# Patient Record
Sex: Male | Born: 1995 | Race: Black or African American | Hispanic: No | Marital: Single | Smoking: Never smoker
Health system: Southern US, Community
[De-identification: ages and names within clinical notes are randomized; demographics above are authoritative.]

---

## 2020-04-20 ENCOUNTER — Emergency Department (HOSPITAL_COMMUNITY): Payer: Self-pay

## 2020-04-20 ENCOUNTER — Other Ambulatory Visit: Payer: Self-pay

## 2020-04-20 ENCOUNTER — Encounter (HOSPITAL_COMMUNITY): Payer: Self-pay | Admitting: Emergency Medicine

## 2020-04-20 ENCOUNTER — Emergency Department (HOSPITAL_COMMUNITY)
Admission: EM | Admit: 2020-04-20 | Discharge: 2020-04-20 | Disposition: A | Discharge status: Home / Self Care | Payer: Self-pay | Attending: Emergency Medicine | Admitting: Emergency Medicine

## 2020-04-20 DIAGNOSIS — M25562 Pain in left knee: Secondary | ICD-10-CM | POA: Insufficient documentation

## 2020-04-20 MED ORDER — ACETAMINOPHEN 325 MG PO TABS
650.0000 mg | ORAL_TABLET | Freq: Once | ORAL | Status: AC
  Administered 2020-04-20: 650 mg via ORAL
  Filled 2020-04-20: qty 2

## 2020-04-20 NOTE — ED Notes (Signed)
Pt is sleeping unable to obtain vitals.

## 2020-04-20 NOTE — Discharge Instructions (Addendum)
You can take 600 mg of ibuprofen every 6 hours, you can take 1000 mg of Tylenol every 6 hours, you can alternate these every 3 or you can take them together.  

## 2020-04-20 NOTE — ED Provider Notes (Signed)
[facility] EMERGENCY DEPARTMENT Provider Note   CSN: [id number] Arrival date & time: 04/20/20  0040     History Chief Complaint  Patient presents with  . Knee Injury    Allen Byrd is a 24 y.o. male.   Knee Pain Location:  Knee Knee location:  L knee Pain details:    Quality:  Aching   Radiates to:  Does not radiate   Severity:  Moderate   Onset quality:  Sudden   Timing:  Constant   Progression:  Unchanged Chronicity:  New Dislocation: no   Foreign body present:  No foreign bodies Prior injury to area:  No Relieved by:  Nothing Worsened by:  Bearing weight Ineffective treatments:  None tried Associated symptoms: swelling   Associated symptoms: no back pain and no fever        History reviewed. No pertinent past medical history.  There are no problems to display for this patient.   History reviewed. No pertinent surgical history.     No family history on file.  Social History   Tobacco Use  . Smoking status: Not on file  Substance Use Topics  . Alcohol use: Not on file  . Drug use: Not on file    Home Medications Prior to Admission medications   Not on File    Allergies    Patient has no known allergies.  Review of Systems   Review of Systems  Constitutional: Negative for chills and fever.  HENT: Negative for congestion and rhinorrhea.   Respiratory: Negative for cough and shortness of breath.   Cardiovascular: Negative for chest pain and palpitations.  Gastrointestinal: Negative for diarrhea, nausea and vomiting.  Genitourinary: Negative for difficulty urinating and dysuria.  Musculoskeletal: Positive for arthralgias and joint swelling. Negative for back pain.  Skin: Negative for color change and rash.  Neurological: Negative for light-headedness and headaches.    Physical Exam Updated Vital Signs BP (!) 105/62 (BP Location: Right Arm)   Pulse 57   Temp 98.9 F (37.2 C) (Oral)   Resp 15   Ht  5\' 8"  (1.727 m)   Wt 90 kg   SpO2 99%   BMI 30.17 kg/m   Physical Exam Vitals and nursing note reviewed.  Constitutional:      General: He is not in acute distress.    Appearance: Normal appearance.  HENT:     Head: Normocephalic and atraumatic.     Nose: No rhinorrhea.  Eyes:     General:        Right eye: No discharge.        Left eye: No discharge.     Conjunctiva/sclera: Conjunctivae normal.  Cardiovascular:     Rate and Rhythm: Normal rate and regular rhythm.  Pulmonary:     Effort: Pulmonary effort is normal.     Breath sounds: No stridor.  Abdominal:     General: Abdomen is flat. There is no distension.     Palpations: Abdomen is soft.  Musculoskeletal:        General: Tenderness present. No deformity.     Comments: Decreased range of motion, tenderness to palpation of the medial joint line.  No laxity of the ACL PCL or lateral ligaments.  Mild effusion.  No significant deformity, intact vasculature motor function and sensation all of the left lower extremity  Skin:    General: Skin is warm and dry.  Neurological:     General: No focal deficit present.  Mental Status: He is alert. Mental status is at baseline.     Motor: No weakness.  Psychiatric:        Mood and Affect: Mood normal.        Behavior: Behavior normal.        Thought Content: Thought content normal.     ED Results / Procedures / Treatments   Labs (all labs ordered are listed, but only abnormal results are displayed) Labs Reviewed - No data to display  EKG None  Radiology DG Knee Complete 4 Views Left  Result Date: 04/20/2020 CLINICAL DATA:  Pain while playing football EXAM: LEFT KNEE - COMPLETE 4+ VIEW COMPARISON:  None. FINDINGS: No evidence of fracture, dislocation, or joint effusion. No evidence of arthropathy or other focal bone abnormality. Soft tissues are unremarkable. IMPRESSION: Negative. Electronically Signed   By: Jonna Clark M.D.   On: 04/20/2020 01:43     Procedures Procedures (including critical care time)  Medications Ordered in ED Medications  acetaminophen (TYLENOL) tablet 650 mg (650 mg Oral Given 04/20/20 3710)    ED Course  I have reviewed the triage vital signs and the nursing notes.  Pertinent labs & imaging results that were available during my care of the patient were reviewed by me and considered in my medical decision making (see chart for details).    MDM Rules/Calculators/A&P                          Football related injury, yesterday, was able to bear weight afterwards.  Was having worsening pain today.  Felt a pop.  Concern for ligamentous or meniscus injury.  Plain films obtained by nursing at triage, were reviewed by myself and radiology and show no fracture or malalignment.  He will need outpatient management by orthopedics for further investigation.  Crutches knee immobilizer over-the-counter medications are given.  Strict return precautions given.  No signs of compartment syndrome on exam soft nontender musculature.  Pt is safe for DC home with outpatient follow up. The patient agrees with the plan and has no other questions or concerns.   Final Clinical Impression(s) / ED Diagnoses Final diagnoses:  Acute pain of left knee    Rx / DC Orders ED Discharge Orders         Ordered    AMB referral to orthopedics     Discontinue  Reprint     04/20/20 1106           Sabino Donovan, MD 04/20/20 1112

## 2020-04-20 NOTE — ED Triage Notes (Signed)
Patient reports left knee pain injured while playing football today , pain increases with movement/weaight bearing .

## 2020-04-20 NOTE — Progress Notes (Signed)
Orthopedic Tech Progress Note Patient Details:  Allen Byrd [facility] 07-24-1996 [id number]  Ortho Devices Type of Ortho Device: Knee Immobilizer, Crutches Ortho Device/Splint Location: Left Lower Extremity Ortho Device/Splint Interventions: Ordered, Application   Post Interventions Patient Tolerated: Well Instructions Provided: Adjustment of device, Care of device, Poper ambulation with device   [name] P Harle Stanford 04/20/2020, 11:58 AM

## 2020-04-23 ENCOUNTER — Ambulatory Visit (INDEPENDENT_AMBULATORY_CARE_PROVIDER_SITE_OTHER): Payer: Self-pay | Admitting: Orthopedic Surgery

## 2020-04-23 ENCOUNTER — Encounter: Payer: Self-pay | Admitting: Orthopedic Surgery

## 2020-04-23 VITALS — Ht 68.0 in | Wt 198.0 lb

## 2020-04-23 DIAGNOSIS — M25562 Pain in left knee: Secondary | ICD-10-CM

## 2020-04-23 DIAGNOSIS — M25462 Effusion, left knee: Secondary | ICD-10-CM

## 2020-04-27 ENCOUNTER — Encounter: Payer: Self-pay | Admitting: Orthopedic Surgery

## 2020-04-27 NOTE — Progress Notes (Signed)
Office Visit Note   Patient: Allen Byrd           Date of Birth: 18-Aug-1996           MRN: [id number] Visit Date: 04/23/2020 Requested by: Sabino Donovan, MD [address] [place],  [place] [address] PCP: Patient, No Pcp Per  Subjective: Chief Complaint  Patient presents with  . Left Knee - Pain    HPI: Allen Byrd is a 24 y.o. male who presents to the office complaining of left knee pain.  Patient states that he injured his left knee 4 to 5 days ago when he was playing football.  He fell while being tackled by 2 players.  He notes pain at the time of injury but he was able to return to the game and played the entire rest of the game as well.  He states that his left knee swelled later that night.  He has noted painful weightbearing since he night following the injury.  He states that his knee "feels unstable".  He does note a history of patellar tendinitis previously.  He denies any history of surgery to the left knee.  He has been taking Tylenol to help with pain..                ROS: All systems reviewed are negative as they relate to the chief complaint within the history of present illness.  Patient denies fevers or chills.  Assessment & Plan: Visit Diagnoses:  1. Acute pain of left knee     Plan: Patient is a 24 year old male who presents about 5 days out from football injury.  He fell while being tackled.  He has a video of the incident and is difficult to identify the specific mechanism but it appears that he fell going down on his left knee while being tackled by 2 other players from behind.  He has a large effusion on exam today as well as grade 2 PCL laxity.  The left knee was aspirated of fluid today.  Radiographs from the ER were reviewed and found to be negative for any acute findings.  A hinged knee brace was provided to the patient.  Plan to order MRI of the left knee for further evaluation.  Follow-up to review MRI results.  Patient  agreed with plan.  Follow-Up Instructions: No follow-ups on file.   Orders:  Orders Placed This Encounter  Procedures  . MR Knee Left w/o contrast   No orders of the defined types were placed in this encounter.     Procedures: No procedures performed   Clinical Data: No additional findings.  Objective: Vital Signs: Ht 5\' 8"  (1.727 m)   Wt 198 lb (89.8 kg)   BMI 30.11 kg/m   Physical Exam:  Constitutional: Patient appears well-developed HEENT:  Head: Normocephalic Eyes:EOM are normal Neck: Normal range of motion Cardiovascular: Normal rate Pulmonary/chest: Effort normal Neurologic: Patient is alert Skin: Skin is warm Psychiatric: Patient has normal mood and affect  Ortho Exam: Ortho exam demonstrates left knee with positive effusion.  Extensor mechanism is intact.  He has no significant tenderness to palpation throughout the left knee.  No significant laxity with collateral ligaments.  ACL has good endpoint with Lachman exam without significant laxity compared with the contralateral knee.  No posterior lateral rotatory instability.  He does have grade 2 PCL laxity with pain that is elicited with stressing the PCL.  Negative straight leg raise.  No pain with  internal rotation/external rotation of the left hip joint.  Specialty Comments:  No specialty comments available.  Imaging: No results found.   PMFS History: There are no problems to display for this patient.  No past medical history on file.  No family history on file.  No past surgical history on file. Social History   Occupational History  . Not on file  Tobacco Use  . Smoking status: Never Smoker  . Smokeless tobacco: Never Used  Vaping Use  . Vaping Use: Never used  Substance and Sexual Activity  . Alcohol use: Not on file  . Drug use: Not on file  . Sexual activity: Not on file

## 2020-05-16 ENCOUNTER — Other Ambulatory Visit: Payer: Self-pay

## 2020-05-16 ENCOUNTER — Ambulatory Visit
Admission: RE | Admit: 2020-05-16 | Discharge: 2020-05-16 | Disposition: A | Discharge status: Home / Self Care | Payer: Self-pay | Source: Ambulatory Visit | Attending: Orthopedic Surgery | Admitting: Orthopedic Surgery

## 2020-05-16 DIAGNOSIS — M25562 Pain in left knee: Secondary | ICD-10-CM

## 2020-05-21 ENCOUNTER — Ambulatory Visit (INDEPENDENT_AMBULATORY_CARE_PROVIDER_SITE_OTHER): Payer: Self-pay | Admitting: Orthopedic Surgery

## 2020-05-21 DIAGNOSIS — S83522A Sprain of posterior cruciate ligament of left knee, initial encounter: Secondary | ICD-10-CM

## 2020-05-21 DIAGNOSIS — M25462 Effusion, left knee: Secondary | ICD-10-CM

## 2020-05-25 ENCOUNTER — Encounter: Payer: Self-pay | Admitting: Orthopedic Surgery

## 2020-05-25 NOTE — Progress Notes (Signed)
Office Visit Note   Patient: Allen Byrd           Date of Birth: 1996-09-01           MRN: [id number] Visit Date: 05/21/2020 Requested by: No referring provider defined for this encounter. PCP: Patient, No Pcp Per  Subjective: Chief Complaint  Patient presents with  . Follow-up    HPI: [name] is a 24 y.o. male who presents to the office complaining of left knee pain.  He presents for MRI review of the left knee.  MRI of the left knee revealed high-grade partial tear of the PCL.  He notes that he is feeling improved since last office visit.  He denies any recurrent symptomatic instability of the left knee since last visit.  His range of motion has improved.  He does play club football and he has not returned to play yet.  He works in a [employer] where he does not lift a lot and instead mostly "drills things".               ROS: All systems reviewed are negative as they relate to the chief complaint within the history of present illness.  Patient denies fevers or chills.  Assessment & Plan: Visit Diagnoses:  1. Knee effusion, left   2. Tear of PCL (posterior cruciate ligament) of knee, left, initial encounter     Plan: Patient is a 24 year old male who presents for reevaluation of left knee injury and MRI review.  MRI results are as described above in HPI.  He does have continued PCL laxity on exam.  Fortunately, he has no recurrent symptomatic instability of the left knee.  No mechanical symptoms.  Plan for patient to begin outpatient physical therapy 2 times per week.  He will focus on quadricep strengthening with blood flow restriction therapy.  Follow-up in 3 weeks for clinical recheck.  His goal is to return to playing football.  Follow-Up Instructions: No follow-ups on file.   Orders:  Orders Placed This Encounter  Procedures  . Ambulatory referral to Physical Therapy   No orders of the defined types were placed in this  encounter.     Procedures: No procedures performed   Clinical Data: No additional findings.  Objective: Vital Signs: There were no vitals taken for this visit.  Physical Exam:  Constitutional: Patient appears well-developed HEENT:  Head: Normocephalic Eyes:EOM are normal Neck: Normal range of motion Cardiovascular: Normal rate Pulmonary/chest: Effort normal Neurologic: Patient is alert Skin: Skin is warm Psychiatric: Patient has normal mood and affect  Ortho Exam: Ortho exam demonstrates left knee with positive effusion.  Extensor mechanism intact.  No laxity with Lachman exam varus/valgus stress.  There is grade 2 PCL laxity that is equivalent to the previous examination.  Excellent range of motion with 0 degrees of extension and greater than 120 degrees of flexion.  No tenderness over the medial/lateral joint lines.  Specialty Comments:  No specialty comments available.  Imaging: No results found.   PMFS History: There are no problems to display for this patient.  No past medical history on file.  No family history on file.  No past surgical history on file. Social History   Occupational History  . Not on file  Tobacco Use  . Smoking status: Never Smoker  . Smokeless tobacco: Never Used  Vaping Use  . Vaping Use: Never used  Substance and Sexual Activity  . Alcohol use: Not on file  . Drug use:  Not on file  . Sexual activity: Not on file

## 2020-05-26 ENCOUNTER — Encounter: Payer: Self-pay | Admitting: Orthopedic Surgery

## 2020-06-12 ENCOUNTER — Ambulatory Visit: Payer: Self-pay | Admitting: Physical Therapy

## 2020-06-27 ENCOUNTER — Ambulatory Visit: Payer: Self-pay | Admitting: Physical Therapy

## 2021-08-07 ENCOUNTER — Encounter (HOSPITAL_COMMUNITY): Payer: Self-pay | Admitting: Emergency Medicine

## 2021-08-07 ENCOUNTER — Other Ambulatory Visit: Payer: Self-pay

## 2021-08-07 ENCOUNTER — Emergency Department (HOSPITAL_COMMUNITY)
Admission: EM | Admit: 2021-08-07 | Discharge: 2021-08-08 | Disposition: A | Discharge status: Home / Self Care | Payer: Self-pay | Attending: Emergency Medicine | Admitting: Emergency Medicine

## 2021-08-07 DIAGNOSIS — J069 Acute upper respiratory infection, unspecified: Secondary | ICD-10-CM | POA: Insufficient documentation

## 2021-08-07 DIAGNOSIS — Z20822 Contact with and (suspected) exposure to covid-19: Secondary | ICD-10-CM | POA: Insufficient documentation

## 2021-08-07 LAB — COMPREHENSIVE METABOLIC PANEL
ALT: 32 U/L (ref 0–44)
AST: 26 U/L (ref 15–41)
Albumin: 3.6 g/dL (ref 3.5–5.0)
Alkaline Phosphatase: 91 U/L (ref 38–126)
Anion gap: 9 (ref 5–15)
BUN: 11 mg/dL (ref 6–20)
CO2: 26 mmol/L (ref 22–32)
Calcium: 8.7 mg/dL — ABNORMAL LOW (ref 8.9–10.3)
Chloride: 103 mmol/L (ref 98–111)
Creatinine, Ser: 1.18 mg/dL (ref 0.61–1.24)
GFR, Estimated: >60 mL/min (ref >60)
Glucose, Bld: 106 mg/dL — ABNORMAL HIGH (ref 70–99)
Potassium: 3.5 mmol/L (ref 3.5–5.1)
Sodium: 138 mmol/L (ref 135–145)
Total Bilirubin: 0.7 mg/dL (ref 0.3–1.2)
Total Protein: 7.1 g/dL (ref 6.5–8.1)

## 2021-08-07 LAB — CBC WITH DIFFERENTIAL/PLATELET
Abs Immature Granulocytes: 0.04 10*3/uL (ref 0.00–0.07)
Basophils Absolute: 0 10*3/uL (ref 0.0–0.1)
Basophils Relative: 0 %
Eosinophils Absolute: 0.2 10*3/uL (ref 0.0–0.5)
Eosinophils Relative: 2 %
HCT: 48.7 % (ref 39.0–52.0)
Hemoglobin: 16.4 g/dL (ref 13.0–17.0)
Immature Granulocytes: 0 %
Lymphocytes Relative: 7 %
Lymphs Abs: 0.7 10*3/uL (ref 0.7–4.0)
MCH: 29.4 pg (ref 26.0–34.0)
MCHC: 33.7 g/dL (ref 30.0–36.0)
MCV: 87.3 fL (ref 80.0–100.0)
Monocytes Absolute: 0.6 10*3/uL (ref 0.1–1.0)
Monocytes Relative: 6 %
Neutro Abs: 8.5 10*3/uL — ABNORMAL HIGH (ref 1.7–7.7)
Neutrophils Relative %: 85 %
Platelets: 319 10*3/uL (ref 150–400)
RBC: 5.58 MIL/uL (ref 4.22–5.81)
RDW: 12 % (ref 11.5–15.5)
WBC: 10 10*3/uL (ref 4.0–10.5)
nRBC: 0 % (ref 0.0–0.2)

## 2021-08-07 LAB — RESP PANEL BY RT-PCR (FLU A&B, COVID) ARPGX2
Influenza A by PCR: NEGATIVE
Influenza B by PCR: NEGATIVE
SARS Coronavirus 2 by RT PCR: NEGATIVE

## 2021-08-07 LAB — LIPASE, BLOOD: Lipase: 22 U/L (ref 11–51)

## 2021-08-07 MED ORDER — ONDANSETRON 4 MG PO TBDP
4.0000 mg | ORAL_TABLET | Freq: Once | ORAL | Status: AC
  Administered 2021-08-07: 4 mg via ORAL
  Filled 2021-08-07: qty 1

## 2021-08-07 MED ORDER — ACETAMINOPHEN 500 MG PO TABS
1000.0000 mg | ORAL_TABLET | Freq: Once | ORAL | Status: AC
  Administered 2021-08-07: 1000 mg via ORAL
  Filled 2021-08-07: qty 2

## 2021-08-07 NOTE — ED Triage Notes (Signed)
Pt c/o cough, n/v, and generalized pain since this morning.

## 2021-08-07 NOTE — ED Provider Notes (Signed)
Emergency Medicine Provider Triage Evaluation Note  Collie Wernick , a 25 y.o. male  was evaluated in triage.  Pt complains of fever, body aches, n/v, abd pain, cough. Sxs began this am. No sick contacts. No st or nasal congestion. No diarrhea or urine sxs. Has not taken anything. No medical problems.   Review of Systems  Positive: Cough, body aches, fever, n/v Negative: st  Physical Exam  BP (!) 142/100 (BP Location: Left Arm)   Pulse 92   Temp 98 F (36.7 C) (Oral)   Resp 16   SpO2 98%  Gen:   Awake, no distress   Resp:  Normal effort  MSK:   Moves extremities without difficulty  Other:  Mild diffuse ttp of the abd  Medical Decision Making  Medically screening exam initiated at 10:55 PM.  Appropriate orders placed.  Braidyn Scorsone was informed that the remainder of the evaluation will be completed by another provider, this initial triage assessment does not replace that evaluation, and the importance of remaining in the ED until their evaluation is complete.  Labs, resp panel   Alveria Apley, PA-C 08/07/21 2256    Virgina Norfolk, DO 08/08/21 1819

## 2021-08-08 ENCOUNTER — Emergency Department (HOSPITAL_COMMUNITY): Payer: Self-pay

## 2021-08-08 MED ORDER — ONDANSETRON 4 MG PO TBDP
4.0000 mg | ORAL_TABLET | Freq: Three times a day (TID) | ORAL | 0 refills | Status: AC | PRN
Start: 2021-08-08 — End: ?

## 2021-08-08 MED ORDER — ALBUTEROL SULFATE HFA 108 (90 BASE) MCG/ACT IN AERS
2.0000 | INHALATION_SPRAY | RESPIRATORY_TRACT | 0 refills | Status: AC | PRN
Start: 2021-08-08 — End: ?

## 2021-08-08 NOTE — ED Provider Notes (Signed)
[facility]-EMERGENCY DEPT Provider Note   CSN: [id number] Arrival date & time: 08/07/21  2231     History Chief Complaint  Patient presents with   Cough    Allen Byrd is a 25 y.o. male.  HPI     This is a 25 year old male who presents with body aches, diarrhea, nausea, vomiting, and cough.  Patient reports 1 day history of symptoms.  No known sick contacts.  No fevers but reports significant body aches and lower back pain that does not lateralize.  He also reports "rib pain."  States it is worse with coughing.  Reports a history of asthma but no recent hospitalization.  He did not take anything for his symptoms at home and did not use an inhaler.  Right now he states he is significantly improved after getting a gram of Tylenol from [facility] provider.  History reviewed. No pertinent past medical history.  There are no problems to display for this patient.   History reviewed. No pertinent surgical history.     No family history on file.  Social History   Tobacco Use   Smoking status: Never   Smokeless tobacco: Never  Vaping Use   Vaping Use: Never used    Home Medications Prior to Admission medications   Medication Sig Start Date End Date Taking? Authorizing Provider  albuterol (VENTOLIN HFA) 108 (90 Base) MCG/ACT inhaler Inhale 2 puffs into the lungs every 4 (four) hours as needed for wheezing or shortness of breath. 08/08/21  Yes Mileydi Milsap, Mayer Masker, MD  ondansetron (ZOFRAN ODT) 4 MG disintegrating tablet Take 1 tablet (4 mg total) by mouth every 8 (eight) hours as needed for nausea or vomiting. 08/08/21  Yes Lorenso Quirino, Mayer Masker, MD    Allergies    Latex  Review of Systems   Review of Systems  Constitutional:  Positive for chills and fever.  Respiratory:  Positive for cough. Negative for shortness of breath.   Cardiovascular:  Negative for chest pain.  Gastrointestinal:  Positive for diarrhea, nausea and vomiting. Negative  for abdominal pain.  Genitourinary:  Negative for dysuria.  All other systems reviewed and are negative.  Physical Exam Updated Vital Signs BP (!) 110/58 (BP Location: Left Arm)   Pulse 90   Temp 98 F (36.7 C) (Oral)   Resp 17   SpO2 97%   Physical Exam Vitals and nursing note reviewed.  Constitutional:      Appearance: He is well-developed. He is not ill-appearing.  HENT:     Head: Normocephalic and atraumatic.     Nose: Nose normal.     Mouth/Throat:     Mouth: Mucous membranes are moist.  Eyes:     Pupils: Pupils are equal, round, and reactive to light.  Cardiovascular:     Rate and Rhythm: Normal rate and regular rhythm.     Heart sounds: Normal heart sounds. No murmur heard. Pulmonary:     Effort: Pulmonary effort is normal. No respiratory distress.     Breath sounds: Normal breath sounds. No wheezing.  Abdominal:     Palpations: Abdomen is soft.     Tenderness: There is no abdominal tenderness. There is no rebound.  Musculoskeletal:     Cervical back: Neck supple.     Right lower leg: No edema.     Left lower leg: No edema.  Lymphadenopathy:     Cervical: No cervical adenopathy.  Skin:    General: Skin is warm and dry.  Neurological:  Mental Status: He is alert and oriented to person, place, and time.  Psychiatric:        Mood and Affect: Mood normal.    ED Results / Procedures / Treatments   Labs (all labs ordered are listed, but only abnormal results are displayed) Labs Reviewed  CBC WITH DIFFERENTIAL/PLATELET - Abnormal; Notable for the following components:      Result Value   Neutro Abs 8.5 (*)    All other components within normal limits  COMPREHENSIVE METABOLIC PANEL - Abnormal; Notable for the following components:   Glucose, Bld 106 (*)    Calcium 8.7 (*)    All other components within normal limits  RESP PANEL BY RT-PCR (FLU A&B, COVID) ARPGX2  LIPASE, BLOOD    EKG None  Radiology DG Chest Portable 1 View  Result Date:  08/08/2021 CLINICAL DATA:  Cough, chest pain EXAM: PORTABLE CHEST 1 VIEW COMPARISON:  None. FINDINGS: Minimal left basilar atelectasis. Lungs are otherwise clear. No pneumothorax or pleural effusion. Cardiac size within normal limits. Pulmonary vascularity is normal. No acute bone abnormality. IMPRESSION: Minimal left basilar atelectasis. Electronically Signed   By: Helyn Numbers M.D.   On: 08/08/2021 01:01    Procedures Procedures   Medications Ordered in ED Medications  ondansetron (ZOFRAN-ODT) disintegrating tablet 4 mg (4 mg Oral Given 08/07/21 2301)  acetaminophen (TYLENOL) tablet 1,000 mg (1,000 mg Oral Given 08/07/21 2301)    ED Course  I have reviewed the triage vital signs and the nursing notes.  Pertinent labs & imaging results that were available during my care of the patient were reviewed by me and considered in my medical decision making (see chart for details).    MDM Rules/Calculators/A&P                           Patient presents with upper respiratory complaints, nausea, vomiting, diarrhea, and chills.  He is nontoxic and vital signs are reassuring.  He is afebrile.  His physical exam is fairly benign.  He has good breath sounds bilaterally.  He is complaining of some "rib pain."  I have reviewed [facility] lab work and it is largely reassuring including negative COVID and influenza testing.  I still highly suspect viral etiology although we will obtain a chest x-ray to rule out pneumonia given his complaints of rib pain.  Patient reports much improvement with Tylenol.  Chest x-ray shows no evidence of pneumothorax or pneumonia.  There is some atelectasis which could be causing the patient some discomfort.  Will provide with an inhaler given reported history of asthma as well as Zofran for nausea.  Recommend supportive measures at home.  After history, exam, and medical workup I feel the patient has been appropriately medically screened and is safe for discharge home. Pertinent  diagnoses were discussed with the patient. Patient was given return precautions.   Final Clinical Impression(s) / ED Diagnoses Final diagnoses:  Viral URI with cough    Rx / DC Orders ED Discharge Orders          Ordered    ondansetron (ZOFRAN ODT) 4 MG disintegrating tablet  Every 8 hours PRN        08/08/21 0107    albuterol (VENTOLIN HFA) 108 (90 Base) MCG/ACT inhaler  Every 4 hours PRN        08/08/21 0107             Shon Baton, MD 08/08/21 0110

## 2021-08-08 NOTE — Discharge Instructions (Signed)
You were seen today for upper respiratory symptoms.  Your influenza and COVID testing are negative.  Your chest x-ray is largely reassuring.  Use your inhaler given your history of asthma for any cough or shortness of breath.  Use Tylenol or Motrin for body aches and pains.

## 2023-07-18 IMAGING — DX DG CHEST 1V PORT
1 series · 1 of 1 positions shown · non-contrast
Comparison: None.

CLINICAL DATA: Cough, chest pain

EXAM:
PORTABLE CHEST 1 VIEW

[chest ap]
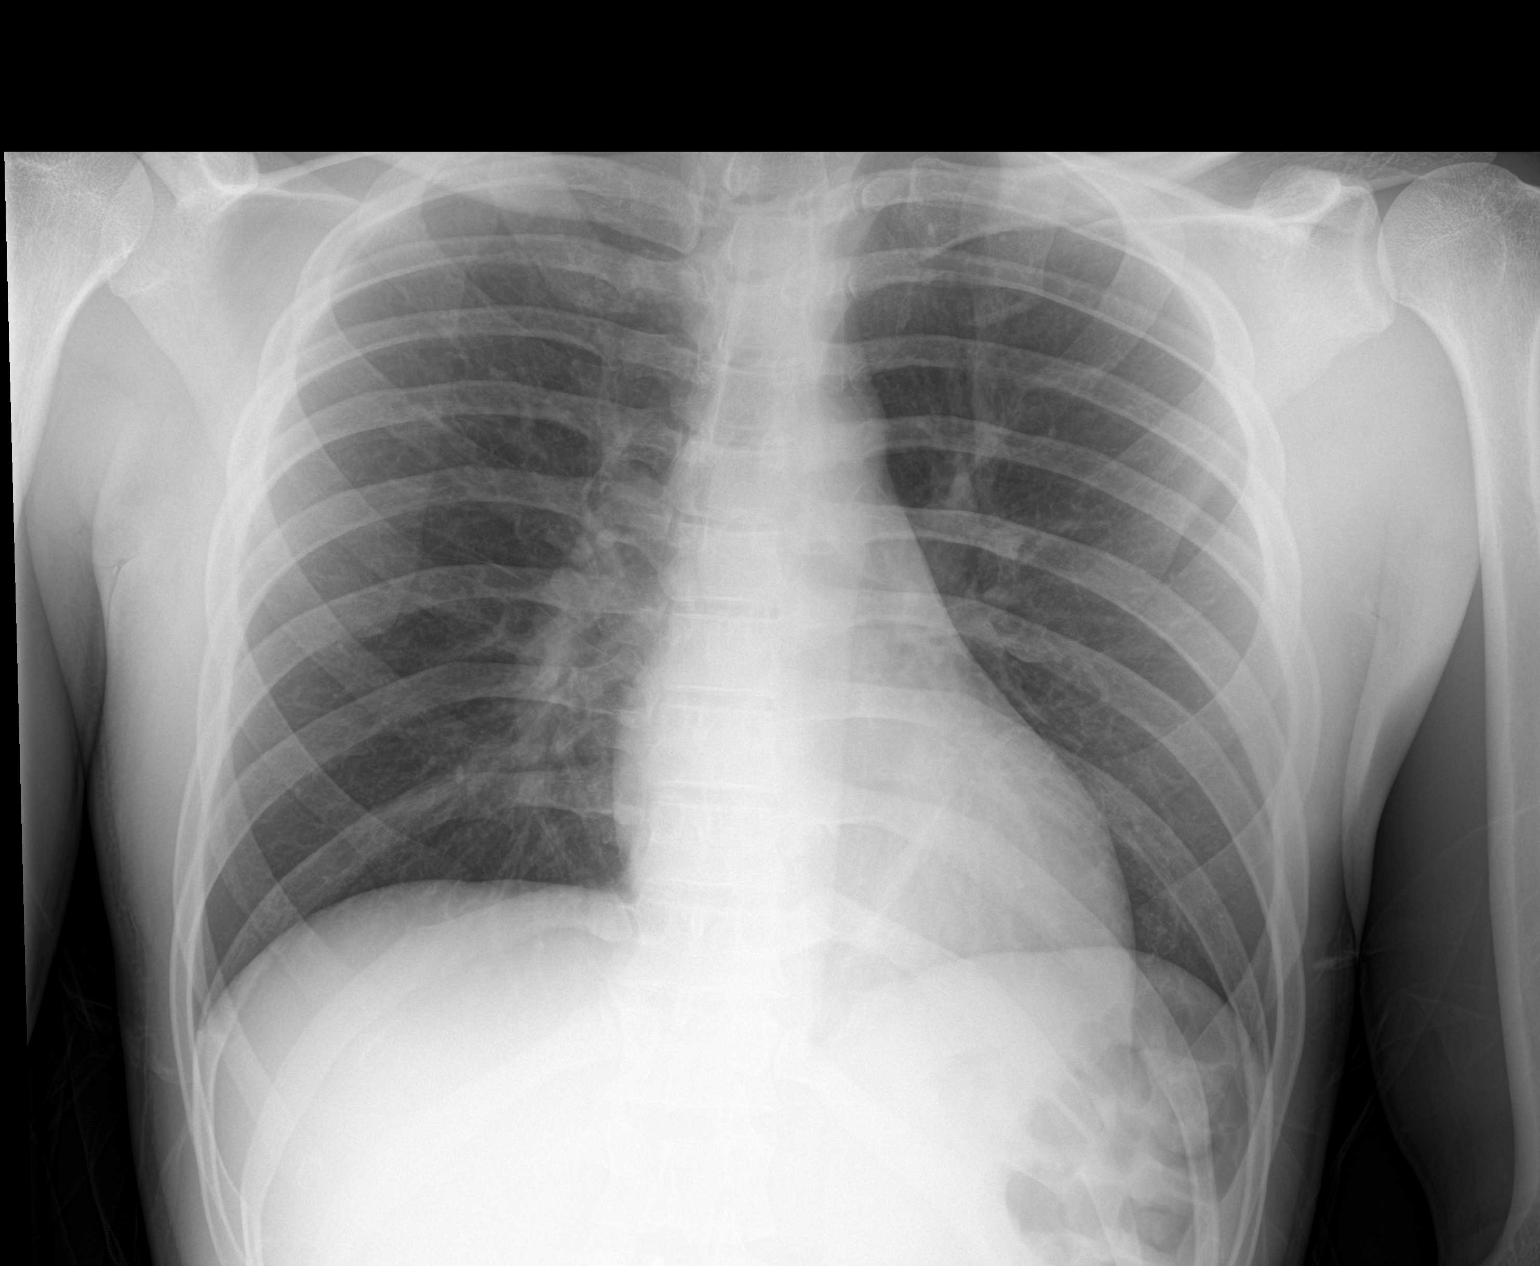

[1 of 1 positions shown; findings below may reference images not displayed]

FINDINGS: Minimal left basilar atelectasis. Lungs are otherwise clear. No
pneumothorax or pleural effusion. Cardiac size within normal limits.
Pulmonary vascularity is normal. No acute bone abnormality.
IMPRESSION: Minimal left basilar atelectasis.
